# Patient Record
Sex: Male | Born: 1975 | Race: White | Hispanic: No | Marital: Married | Smoking: Never smoker
Health system: Southern US, Community
[De-identification: ages and names within clinical notes are randomized; demographics above are authoritative.]

## PROBLEM LIST (undated history)

## (undated) DIAGNOSIS — H9071 Mixed conductive and sensorineural hearing loss, unilateral, right ear, with unrestricted hearing on the contralateral side: Secondary | ICD-10-CM

## (undated) DIAGNOSIS — G5602 Carpal tunnel syndrome, left upper limb: Secondary | ICD-10-CM

## (undated) HISTORY — PX: HAND SURGERY: SHX662

## (undated) HISTORY — PX: HERNIA REPAIR: SHX51

## (undated) HISTORY — PX: TONSILLECTOMY: SUR1361

---

## 2010-06-01 ENCOUNTER — Ambulatory Visit (HOSPITAL_COMMUNITY): Admission: EM | Admit: 2010-06-01 | Discharge: 2010-06-02 | Payer: Self-pay | Admitting: Emergency Medicine

## 2010-09-17 LAB — DIFFERENTIAL
Basophils Absolute: 0 10*3/uL (ref 0.0–0.1)
Basophils Relative: 0 % (ref 0–1)
Eosinophils Absolute: 0 10*3/uL (ref 0.0–0.7)
Lymphs Abs: 1.2 10*3/uL (ref 0.7–4.0)
Neutrophils Relative %: 81 % — ABNORMAL HIGH (ref 43–77)

## 2010-09-17 LAB — PROTIME-INR
INR: 0.95 (ref 0.00–1.49)
Prothrombin Time: 12.9 seconds (ref 11.6–15.2)

## 2010-09-17 LAB — CBC
MCH: 30.8 pg (ref 26.0–34.0)
MCHC: 36.3 g/dL — ABNORMAL HIGH (ref 30.0–36.0)
Platelets: 49 10*3/uL — ABNORMAL LOW (ref 150–400)
RBC: 5.07 MIL/uL (ref 4.22–5.81)
RDW: 12.2 % (ref 11.5–15.5)

## 2010-09-17 LAB — POCT I-STAT, CHEM 8
Calcium, Ion: 0.89 mmol/L — ABNORMAL LOW (ref 1.12–1.32)
Glucose, Bld: 92 mg/dL (ref 70–99)
HCT: 44 % (ref 39.0–52.0)
Hemoglobin: 15 g/dL (ref 13.0–17.0)
TCO2: 23 mmol/L (ref 0–100)

## 2011-10-29 IMAGING — CR DG CHEST 1V PORT
1 series · 1 of 1 positions shown · non-contrast
Comparison: 06/01/2010

CLINICAL DATA: Central line placement

PORTABLE CHEST - 1 VIEW

[view not recorded]
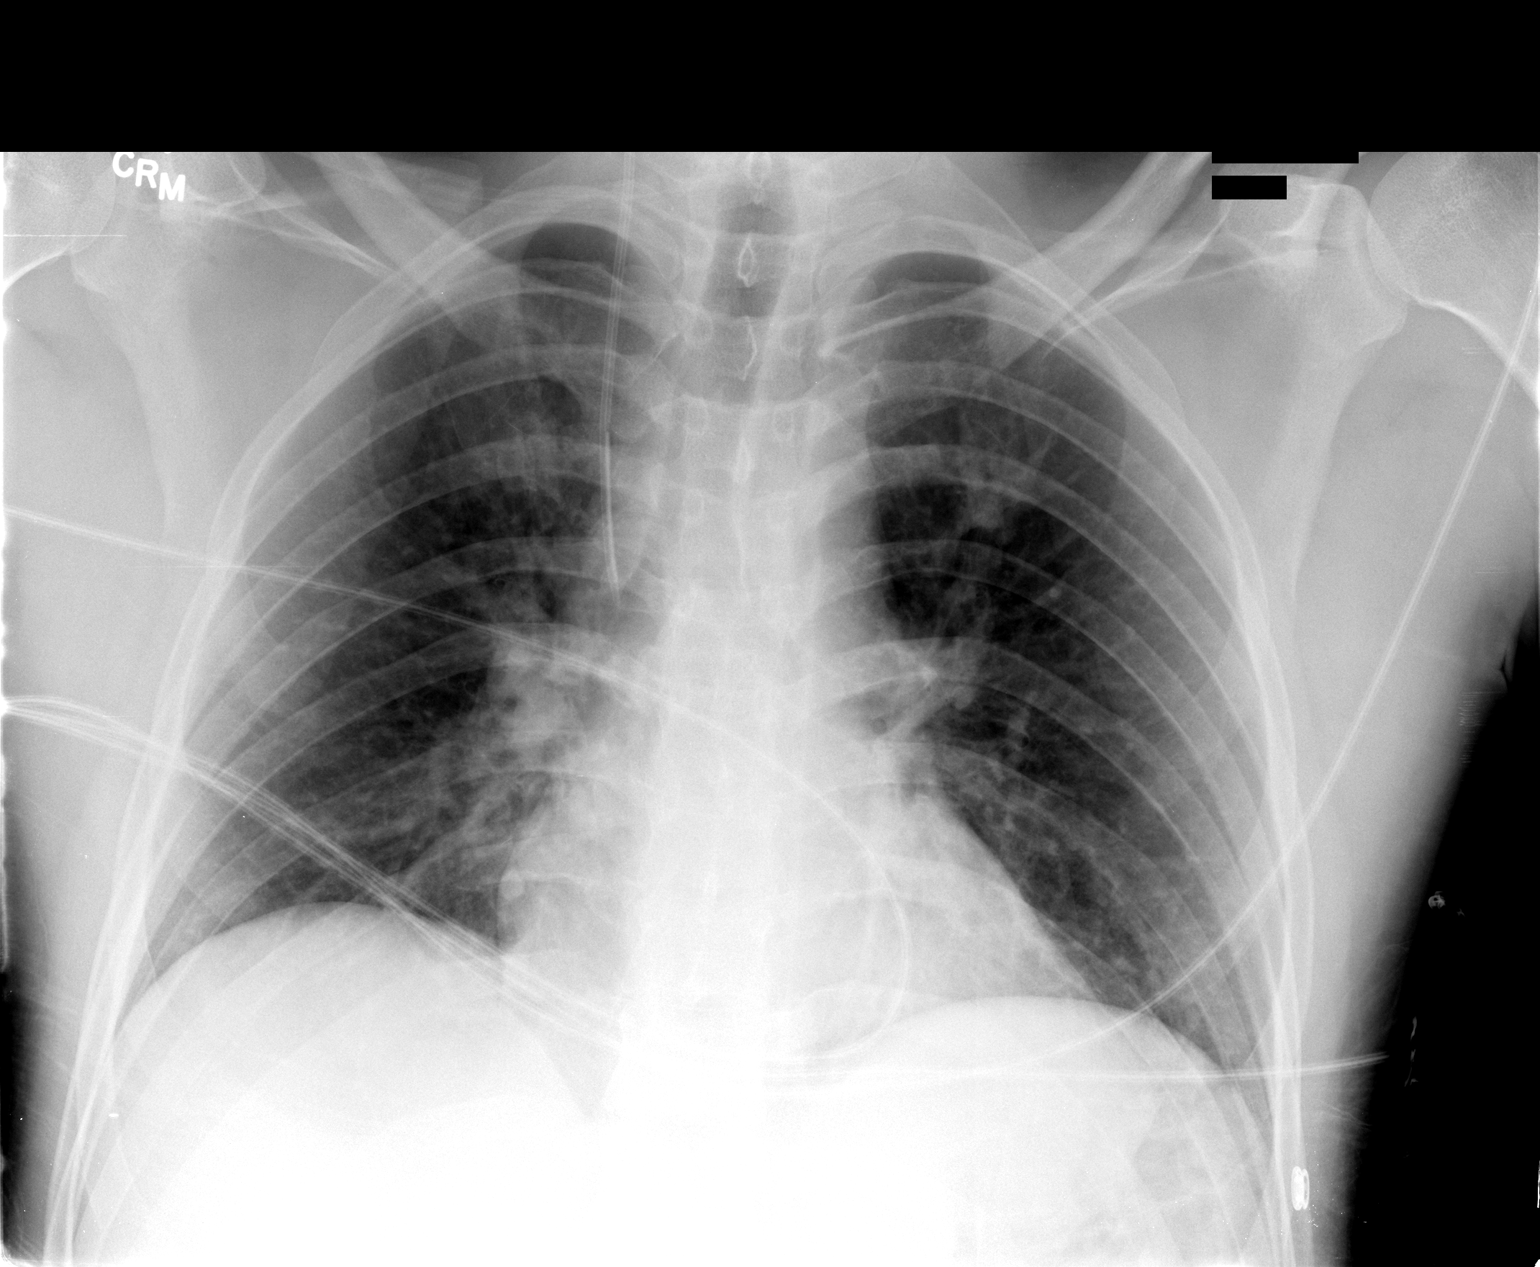

[1 of 1 positions shown; findings below may reference images not displayed]

FINDINGS: Interval placement right-sided internal jugular line with
the tip in the upper SVC.  There is no pneumothorax.  Lung volumes
are low with increasing bibasilar atelectasis.  No edema is seen.
The heart is normal in size.  The upper abdomen and osseous
structures are unchanged.
IMPRESSION: Uncomplicated placement of a right upper extremity central line.
Increasing bibasilar atelectasis without other interval change.

## 2018-04-14 ENCOUNTER — Other Ambulatory Visit: Payer: Self-pay | Admitting: *Deleted

## 2018-04-14 ENCOUNTER — Encounter: Payer: Self-pay | Admitting: Neurology

## 2018-04-14 DIAGNOSIS — G5602 Carpal tunnel syndrome, left upper limb: Secondary | ICD-10-CM

## 2018-04-19 ENCOUNTER — Ambulatory Visit (INDEPENDENT_AMBULATORY_CARE_PROVIDER_SITE_OTHER): Payer: BLUE CROSS/BLUE SHIELD | Admitting: Neurology

## 2018-04-19 DIAGNOSIS — G5602 Carpal tunnel syndrome, left upper limb: Secondary | ICD-10-CM | POA: Diagnosis not present
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
## 2018-05-05 ENCOUNTER — Other Ambulatory Visit: Payer: Self-pay | Admitting: Orthopedic Surgery

## 2018-05-19 ENCOUNTER — Encounter (HOSPITAL_BASED_OUTPATIENT_CLINIC_OR_DEPARTMENT_OTHER): Payer: Self-pay | Admitting: *Deleted

## 2018-05-19 ENCOUNTER — Other Ambulatory Visit: Payer: Self-pay

## 2018-05-23 ENCOUNTER — Other Ambulatory Visit: Payer: Self-pay

## 2018-05-23 ENCOUNTER — Ambulatory Visit (HOSPITAL_BASED_OUTPATIENT_CLINIC_OR_DEPARTMENT_OTHER): Payer: BLUE CROSS/BLUE SHIELD | Admitting: Anesthesiology

## 2018-05-23 ENCOUNTER — Encounter (HOSPITAL_BASED_OUTPATIENT_CLINIC_OR_DEPARTMENT_OTHER): Payer: Self-pay | Admitting: Anesthesiology

## 2018-05-23 ENCOUNTER — Ambulatory Visit (HOSPITAL_BASED_OUTPATIENT_CLINIC_OR_DEPARTMENT_OTHER)
Admission: RE | Admit: 2018-05-23 | Discharge: 2018-05-23 | Disposition: A | Discharge status: Home / Self Care | Payer: BLUE CROSS/BLUE SHIELD | Source: Ambulatory Visit | Attending: Orthopedic Surgery | Admitting: Orthopedic Surgery

## 2018-05-23 ENCOUNTER — Encounter (HOSPITAL_BASED_OUTPATIENT_CLINIC_OR_DEPARTMENT_OTHER)
Admission: RE | Disposition: A | Discharge status: Home / Self Care | Payer: Self-pay | Source: Ambulatory Visit | Attending: Orthopedic Surgery

## 2018-05-23 DIAGNOSIS — G5602 Carpal tunnel syndrome, left upper limb: Secondary | ICD-10-CM | POA: Diagnosis not present

## 2018-05-23 HISTORY — DX: Mixed conductive and sensorineural hearing loss, unilateral, right ear, with unrestricted hearing on the contralateral side: H90.71

## 2018-05-23 HISTORY — DX: Carpal tunnel syndrome, left upper limb: G56.02

## 2018-05-23 HISTORY — PX: CARPAL TUNNEL RELEASE: SHX101

## 2018-05-23 SURGERY — CARPAL TUNNEL RELEASE
Anesthesia: General | Site: Hand | Laterality: Left

## 2018-05-23 MED ORDER — FENTANYL CITRATE (PF) 100 MCG/2ML IJ SOLN
INTRAMUSCULAR | Status: AC
  Filled 2018-05-23: qty 2

## 2018-05-23 MED ORDER — HYDROCODONE-ACETAMINOPHEN 5-325 MG PO TABS: ORAL_TABLET | ORAL | 0 refills | Status: AC

## 2018-05-23 MED ORDER — DEXAMETHASONE SODIUM PHOSPHATE 10 MG/ML IJ SOLN
INTRAMUSCULAR | Status: AC
  Filled 2018-05-23: qty 1

## 2018-05-23 MED ORDER — MIDAZOLAM HCL 2 MG/2ML IJ SOLN
1.0000 mg | INTRAMUSCULAR | Status: DC | PRN
  Administered 2018-05-23: 2 mg via INTRAVENOUS

## 2018-05-23 MED ORDER — OXYCODONE HCL 5 MG PO TABS
ORAL_TABLET | ORAL | Status: AC
  Filled 2018-05-23: qty 1

## 2018-05-23 MED ORDER — LACTATED RINGERS IV SOLN
INTRAVENOUS | Status: DC
  Administered 2018-05-23: 13:00:00 via INTRAVENOUS

## 2018-05-23 MED ORDER — FENTANYL CITRATE (PF) 100 MCG/2ML IJ SOLN: 25.0000 ug | INTRAMUSCULAR | Status: DC | PRN

## 2018-05-23 MED ORDER — ONDANSETRON HCL 4 MG/2ML IJ SOLN
INTRAMUSCULAR | Status: AC
  Filled 2018-05-23: qty 2

## 2018-05-23 MED ORDER — SCOPOLAMINE 1 MG/3DAYS TD PT72: 1.0000 | MEDICATED_PATCH | Freq: Once | TRANSDERMAL | Status: DC | PRN

## 2018-05-23 MED ORDER — PROPOFOL 10 MG/ML IV BOLUS
INTRAVENOUS | Status: DC | PRN
  Administered 2018-05-23: 200 mg via INTRAVENOUS

## 2018-05-23 MED ORDER — PROPOFOL 500 MG/50ML IV EMUL
INTRAVENOUS | Status: AC
  Filled 2018-05-23: qty 50

## 2018-05-23 MED ORDER — LIDOCAINE HCL (CARDIAC) PF 100 MG/5ML IV SOSY
PREFILLED_SYRINGE | INTRAVENOUS | Status: DC | PRN
  Administered 2018-05-23: 100 mg via INTRAVENOUS

## 2018-05-23 MED ORDER — CHLORHEXIDINE GLUCONATE 4 % EX LIQD: 60.0000 mL | Freq: Once | CUTANEOUS | Status: DC

## 2018-05-23 MED ORDER — CEFAZOLIN SODIUM-DEXTROSE 2-4 GM/100ML-% IV SOLN
2.0000 g | INTRAVENOUS | Status: AC
  Administered 2018-05-23: 2 g via INTRAVENOUS

## 2018-05-23 MED ORDER — PROMETHAZINE HCL 25 MG/ML IJ SOLN: 6.2500 mg | INTRAMUSCULAR | Status: DC | PRN

## 2018-05-23 MED ORDER — FENTANYL CITRATE (PF) 100 MCG/2ML IJ SOLN
50.0000 ug | INTRAMUSCULAR | Status: DC | PRN
  Administered 2018-05-23: 100 ug via INTRAVENOUS

## 2018-05-23 MED ORDER — DEXAMETHASONE SODIUM PHOSPHATE 10 MG/ML IJ SOLN
INTRAMUSCULAR | Status: DC | PRN
  Administered 2018-05-23: 10 mg via INTRAVENOUS

## 2018-05-23 MED ORDER — ONDANSETRON HCL 4 MG/2ML IJ SOLN
INTRAMUSCULAR | Status: DC | PRN
  Administered 2018-05-23: 4 mg via INTRAVENOUS

## 2018-05-23 MED ORDER — CEFAZOLIN SODIUM-DEXTROSE 2-4 GM/100ML-% IV SOLN
INTRAVENOUS | Status: AC
  Filled 2018-05-23: qty 100

## 2018-05-23 MED ORDER — BUPIVACAINE HCL (PF) 0.25 % IJ SOLN
INTRAMUSCULAR | Status: DC | PRN
  Administered 2018-05-23: 10 mL

## 2018-05-23 MED ORDER — MIDAZOLAM HCL 2 MG/2ML IJ SOLN
INTRAMUSCULAR | Status: AC
  Filled 2018-05-23: qty 2

## 2018-05-23 MED ORDER — OXYCODONE HCL 5 MG PO TABS
5.0000 mg | ORAL_TABLET | Freq: Once | ORAL | Status: AC
  Administered 2018-05-23: 5 mg via ORAL

## 2018-05-23 SURGICAL SUPPLY — 33 items
BANDAGE ACE 3X5.8 VEL STRL LF (GAUZE/BANDAGES/DRESSINGS) ×3 IMPLANT
BLADE SURG 15 STRL LF DISP TIS (BLADE) ×2 IMPLANT
BLADE SURG 15 STRL SS (BLADE) ×4
BNDG ESMARK 4X9 LF (GAUZE/BANDAGES/DRESSINGS) ×3 IMPLANT
BNDG GAUZE ELAST 4 BULKY (GAUZE/BANDAGES/DRESSINGS) ×3 IMPLANT
CHLORAPREP W/TINT 26ML (MISCELLANEOUS) ×3 IMPLANT
CORD BIPOLAR FORCEPS 12FT (ELECTRODE) ×3 IMPLANT
COVER BACK TABLE 60X90IN (DRAPES) ×3 IMPLANT
COVER MAYO STAND STRL (DRAPES) ×3 IMPLANT
COVER WAND RF STERILE (DRAPES) IMPLANT
CUFF TOURNIQUET SINGLE 18IN (TOURNIQUET CUFF) ×3 IMPLANT
DRAPE EXTREMITY T 121X128X90 (DRAPE) ×3 IMPLANT
DRAPE SURG 17X23 STRL (DRAPES) ×3 IMPLANT
DRSG PAD ABDOMINAL 8X10 ST (GAUZE/BANDAGES/DRESSINGS) ×3 IMPLANT
GAUZE SPONGE 4X4 12PLY STRL (GAUZE/BANDAGES/DRESSINGS) ×3 IMPLANT
GAUZE XEROFORM 1X8 LF (GAUZE/BANDAGES/DRESSINGS) ×3 IMPLANT
GLOVE BIO SURGEON STRL SZ7.5 (GLOVE) ×3 IMPLANT
GLOVE BIOGEL PI IND STRL 8 (GLOVE) ×1 IMPLANT
GLOVE BIOGEL PI INDICATOR 8 (GLOVE) ×2
GOWN STRL REUS W/ TWL LRG LVL3 (GOWN DISPOSABLE) ×1 IMPLANT
GOWN STRL REUS W/TWL LRG LVL3 (GOWN DISPOSABLE) ×2
GOWN STRL REUS W/TWL XL LVL3 (GOWN DISPOSABLE) ×3 IMPLANT
NEEDLE HYPO 25X1 1.5 SAFETY (NEEDLE) ×3 IMPLANT
NS IRRIG 1000ML POUR BTL (IV SOLUTION) ×3 IMPLANT
PACK BASIN DAY SURGERY FS (CUSTOM PROCEDURE TRAY) ×3 IMPLANT
PADDING CAST ABS 4INX4YD NS (CAST SUPPLIES) ×2
PADDING CAST ABS COTTON 4X4 ST (CAST SUPPLIES) ×1 IMPLANT
STOCKINETTE 4X48 STRL (DRAPES) ×3 IMPLANT
SUT ETHILON 4 0 PS 2 18 (SUTURE) ×3 IMPLANT
SYR BULB 3OZ (MISCELLANEOUS) ×3 IMPLANT
SYR CONTROL 10ML LL (SYRINGE) ×3 IMPLANT
TOWEL GREEN STERILE FF (TOWEL DISPOSABLE) ×6 IMPLANT
UNDERPAD 30X30 (UNDERPADS AND DIAPERS) ×3 IMPLANT

## 2018-05-23 NOTE — Anesthesia Preprocedure Evaluation (Addendum)
Anesthesia Evaluation  Patient identified by MRN, date of birth, ID band Patient awake    Reviewed: Allergy & Precautions, NPO status , Patient's Chart, lab work & pertinent test results  Airway Mallampati: II  TM Distance: >3 FB Neck ROM: Full    Dental  (+) Teeth Intact, Dental Advisory Given   Pulmonary neg pulmonary ROS,    Pulmonary exam normal breath sounds clear to auscultation       Cardiovascular Exercise Tolerance: Good negative cardio ROS Normal cardiovascular exam Rhythm:Regular Rate:Normal     Neuro/Psych SDH, resolved  Neuromuscular disease    GI/Hepatic negative GI ROS, Neg liver ROS, neg GERD  ,  Endo/Other  negative endocrine ROS  Renal/GU negative Renal ROS     Musculoskeletal CTS, left hand   Abdominal   Peds  Hematology negative hematology ROS (+)   Anesthesia Other Findings Day of surgery medications reviewed with the patient.  Reproductive/Obstetrics                            Anesthesia Physical Anesthesia Plan  ASA: II  Anesthesia Plan: General   Post-op Pain Management:    Induction: Intravenous  PONV Risk Score and Plan: 2 and Dexamethasone, Ondansetron and Midazolam  Airway Management Planned: LMA  Additional Equipment:   Intra-op Plan:   Post-operative Plan: Extubation in OR  Informed Consent: I have reviewed the patients History and Physical, chart, labs and discussed the procedure including the risks, benefits and alternatives for the proposed anesthesia with the patient or authorized representative who has indicated his/her understanding and acceptance.   Dental advisory given  Plan Discussed with: CRNA  Anesthesia Plan Comments:        Anesthesia Quick Evaluation

## 2018-05-23 NOTE — Anesthesia Procedure Notes (Signed)
Procedure Name: LMA Insertion Date/Time: 05/23/2018 1:13 PM Performed by: Burna Cashonrad, Layann Bluett C, CRNA Pre-anesthesia Checklist: Patient identified, Emergency Drugs available, Suction available and Patient being monitored Patient Re-evaluated:Patient Re-evaluated prior to induction Oxygen Delivery Method: Circle system utilized Preoxygenation: Pre-oxygenation with 100% oxygen Induction Type: IV induction Ventilation: Mask ventilation without difficulty LMA: LMA inserted LMA Size: 5.0 Number of attempts: 1 Airway Equipment and Method: Bite block Placement Confirmation: positive ETCO2 Tube secured with: Tape Dental Injury: Teeth and Oropharynx as per pre-operative assessment

## 2018-05-23 NOTE — Transfer of Care (Signed)
Immediate Anesthesia Transfer of Care Note  Patient: Mike Burch  Procedure(s) Performed: LEFT CARPAL TUNNEL RELEASE (Left Hand)  Patient Location: PACU  Anesthesia Type:General  Level of Consciousness: sedated  Airway & Oxygen Therapy: Patient Spontanous Breathing and Patient connected to face mask oxygen  Post-op Assessment: Report given to RN and Post -op Vital signs reviewed and stable  Post vital signs: Reviewed and stable  Last Vitals:  Vitals Value Taken Time  BP 112/82 05/23/2018  1:42 PM  Temp    Pulse 82 05/23/2018  1:44 PM  Resp 15 05/23/2018  1:44 PM  SpO2 99 % 05/23/2018  1:44 PM  Vitals shown include unvalidated device data.  Last Pain:  Vitals:   05/23/18 1238  TempSrc: Oral  PainSc: 0-No pain      Patients Stated Pain Goal: 3 (05/23/18 1238)  Complications: No apparent anesthesia complications

## 2018-05-23 NOTE — Discharge Instructions (Addendum)

## 2018-05-23 NOTE — Anesthesia Postprocedure Evaluation (Signed)
Anesthesia Post Note  Patient: Lindwood QuaJohn Dormer  Procedure(s) Performed: LEFT CARPAL TUNNEL RELEASE (Left Hand)     Patient location during evaluation: PACU Anesthesia Type: General Level of consciousness: awake and alert, awake and oriented Pain management: pain level controlled Vital Signs Assessment: post-procedure vital signs reviewed and stable Respiratory status: spontaneous breathing, nonlabored ventilation and respiratory function stable Cardiovascular status: blood pressure returned to baseline and stable Postop Assessment: no apparent nausea or vomiting Anesthetic complications: no    Last Vitals:  Vitals:   05/23/18 1400 05/23/18 1415  BP: 123/89 (!) 125/92  Pulse: 73 86  Resp: 16 17  Temp:    SpO2: 100% 94%    Last Pain:  Vitals:   05/23/18 1400  TempSrc:   PainSc: 4                  Cecile HearingStephen Edward Tyger Wichman

## 2018-05-23 NOTE — Op Note (Signed)
05/23/2018 [facility]                              OPERATIVE REPORT   PREOPERATIVE DIAGNOSIS:  Left carpal tunnel syndrome.  POSTOPERATIVE DIAGNOSIS:  Left carpal tunnel syndrome.  PROCEDURE:  Left carpal tunnel release.  SURGEON:  Mike Loa, MD  ASSISTANT:  none.  ANESTHESIA: General  IV FLUIDS:  Per anesthesia flow sheet.  ESTIMATED BLOOD LOSS:  Minimal.  COMPLICATIONS:  None.  SPECIMENS:  None.  TOURNIQUET TIME:   * Missing tourniquet times found for documented tourniquets in log: [id number] *  DISPOSITION:  Stable to PACU.  LOCATION:  SURGERY CENTER  INDICATIONS:  42 yo male with numbness in left hand.  Positive nerve conduction studies.  He wishes to have a carpal tunnel release for management of his symptoms.  Risks, benefits and alternatives of surgery were discussed including the risk of blood loss; infection; damage to nerves, vessels, tendons, ligaments, bone; failure of surgery; need for additional surgery; complications with wound healing; continued pain; recurrence of carpal tunnel syndrome; and damage to motor branch. He voiced understanding of these risks and elected to proceed.   OPERATIVE COURSE:  After being identified preoperatively by myself, the patient and I agreed upon the procedure and site of procedure.  The surgical site was marked.  The risks, benefits, and alternatives of the surgery were reviewed and he wished to proceed.  Surgical consent had been signed.  He was given IV Ancef as preoperative antibiotic prophylaxis.  He was transferred to the operating room and placed on the operating room table in supine position with the Left upper extremity on an armboard.  General anesthesia was induced by the anesthesiologist.  Left upper extremity was prepped and draped in normal sterile orthopaedic fashion.  A surgical pause was performed between the surgeons, anesthesia, and operating room staff, and all were in agreement as to the  patient, procedure, and site of procedure.  Tourniquet at the proximal aspect of the extremity was inflated to 250 mmHg after exsanguination of the arm with an Esmarch bandage  Incision was made over the transverse carpal ligament and carried into the subcutaneous tissues by spreading technique.  Bipolar electrocautery was used to obtain hemostasis.  The palmar fascia was sharply incised.  The transverse carpal ligament was identified and sharply incised.  It was incised distally first.  Care was taken to ensure complete decompression distally.  It was then incised proximally.  Scissors were used to split the distal aspect of the volar antebrachial fascia.  A finger was placed into the wound to ensure complete decompression, which was the case.  The nerve was examined.  It was flattened and hyperemic. and It was adherent to the radial leaflet.  The motor branch was identified and was intact.  The wound was copiously irrigated with sterile saline.  It was then closed with 4-0 nylon in a horizontal mattress fashion.  It was injected with 0.25% plain Marcaine to aid in postoperative analgesia.  It was dressed with sterile Xeroform, 4x4s, an ABD, and wrapped with Kerlix and an Ace bandage.  Tourniquet was deflated at 15 minutes.  Fingertips were pink with brisk capillary refill after deflation of the tourniquet.  Operative drapes were broken down.  The patient was awoken from anesthesia safely.  He was transferred back to stretcher and taken to the PACU in stable condition.  I will see him back  in the office in 1 week for postoperative followup.  I will give him a prescription for Norco 5/325 1-2 tabs PO q6 hours prn pain, dispense # 20.    Mike LoaKevin Marshon Bangs, MD Electronically signed, 05/23/18
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
## 2018-05-24 ENCOUNTER — Encounter (HOSPITAL_BASED_OUTPATIENT_CLINIC_OR_DEPARTMENT_OTHER): Payer: Self-pay | Admitting: Orthopedic Surgery

## 2020-06-19 ENCOUNTER — Ambulatory Visit (HOSPITAL_COMMUNITY)
Admission: RE | Admit: 2020-06-19 | Discharge: 2020-06-19 | Disposition: A | Discharge status: Home / Self Care | Payer: BC Managed Care – PPO | Source: Ambulatory Visit | Attending: Pulmonary Disease | Admitting: Pulmonary Disease

## 2020-06-19 ENCOUNTER — Other Ambulatory Visit: Payer: Self-pay | Admitting: Nurse Practitioner

## 2020-06-19 DIAGNOSIS — U071 COVID-19: Secondary | ICD-10-CM

## 2020-06-19 MED ORDER — METHYLPREDNISOLONE SODIUM SUCC 125 MG IJ SOLR: 125.0000 mg | Freq: Once | INTRAMUSCULAR | Status: DC | PRN

## 2020-06-19 MED ORDER — EPINEPHRINE 0.3 MG/0.3ML IJ SOAJ: 0.3000 mg | Freq: Once | INTRAMUSCULAR | Status: DC | PRN

## 2020-06-19 MED ORDER — SODIUM CHLORIDE 0.9 % IV SOLN: Freq: Once | INTRAVENOUS | Status: AC

## 2020-06-19 MED ORDER — ACETAMINOPHEN 325 MG PO TABS
650.0000 mg | ORAL_TABLET | Freq: Once | ORAL | Status: AC
  Administered 2020-06-19: 650 mg via ORAL
  Filled 2020-06-19: qty 2

## 2020-06-19 MED ORDER — DIPHENHYDRAMINE HCL 50 MG/ML IJ SOLN: 50.0000 mg | Freq: Once | INTRAMUSCULAR | Status: DC | PRN

## 2020-06-19 MED ORDER — SODIUM CHLORIDE 0.9 % IV SOLN: INTRAVENOUS | Status: DC | PRN

## 2020-06-19 MED ORDER — ALBUTEROL SULFATE HFA 108 (90 BASE) MCG/ACT IN AERS: 2.0000 | INHALATION_SPRAY | Freq: Once | RESPIRATORY_TRACT | Status: DC | PRN

## 2020-06-19 MED ORDER — FAMOTIDINE IN NACL 20-0.9 MG/50ML-% IV SOLN: 20.0000 mg | Freq: Once | INTRAVENOUS | Status: DC | PRN

## 2020-06-19 NOTE — Progress Notes (Signed)
  Diagnosis: COVID-19  Physician: Dr. Wright  Procedure: [facility] Med: bamlanivimab\etesevimab infusion - Provided patient with bamlanimivab\etesevimab fact sheet for patients, parents and caregivers prior to infusion.  Complications: No immediate complications noted.  Discharge: Discharged home   Homer Miller J Folasade Mooty 06/19/2020  

## 2020-06-19 NOTE — Progress Notes (Signed)
I connected by phone with Mike Burch on 06/19/2020 at 12:06 PM to discuss the potential use of a new treatment for mild to moderate COVID-19 viral infection in non-hospitalized patients.  This patient is a 44 y.o. male that meets the FDA criteria for Emergency Use Authorization of COVID monoclonal antibody casirivimab/imdevimab, bamlanivimab/etesevimab, or sotrovimab.  Has a (+) direct SARS-CoV-2 viral test result  Has mild or moderate COVID-19   Is NOT hospitalized due to COVID-19  Is within 10 days of symptom onset  Has at least one of the high risk factor(s) for progression to severe COVID-19 and/or hospitalization as defined in EUA.  Specific high risk criteria : Other high risk medical condition per CDC:  previous brain injury/neuro   I have spoken and communicated the following to the patient or parent/caregiver regarding COVID monoclonal antibody treatment:  1. FDA has authorized the emergency use for the treatment of mild to moderate COVID-19 in adults and pediatric patients with positive results of direct SARS-CoV-2 viral testing who are 42 years of age and older weighing at least 40 kg, and who are at high risk for progressing to severe COVID-19 and/or hospitalization.  2. The significant known and potential risks and benefits of COVID monoclonal antibody, and the extent to which such potential risks and benefits are unknown.  3. Information on available alternative treatments and the risks and benefits of those alternatives, including clinical trials.  4. Patients treated with COVID monoclonal antibody should continue to self-isolate and use infection control measures (e.g., wear mask, isolate, social distance, avoid sharing personal items, clean and disinfect "high touch" surfaces, and frequent handwashing) according to CDC guidelines.   5. The patient or parent/caregiver has the option to accept or refuse COVID monoclonal antibody treatment.  After reviewing this information  with the patient, the patient has agreed to receive one of the available covid 19 monoclonal antibodies and will be provided an appropriate fact sheet prior to infusion. Mayra Reel, NP 06/19/2020 12:06 PM

## 2020-06-19 NOTE — Discharge Instructions (Signed)
10 Things You Can Do to Manage Your COVID-19 Symptoms at Home If you have possible or confirmed COVID-19: 1. Stay home from work and school. And stay away from other public places. If you must go out, avoid using any kind of public transportation, ridesharing, or taxis. 2. Monitor your symptoms carefully. If your symptoms get worse, call your healthcare provider immediately. 3. Get rest and stay hydrated. 4. If you have a medical appointment, call the healthcare provider ahead of time and tell them that you have or may have COVID-19. 5. For medical emergencies, call 911 and notify the dispatch personnel that you have or may have COVID-19. 6. Cover your cough and sneezes with a tissue or use the inside of your elbow. 7. Wash your hands often with soap and water for at least 20 seconds or clean your hands with an alcohol-based hand sanitizer that contains at least 60% alcohol. 8. As much as possible, stay in a specific room and away from other people in your home. Also, you should use a separate bathroom, if available. If you need to be around other people in or outside of the home, wear a mask. 9. Avoid sharing personal items with other people in your household, like dishes, towels, and bedding. 10. Clean all surfaces that are touched often, like counters, tabletops, and doorknobs. Use household cleaning sprays or wipes according to the label instructions. cdc.gov/coronavirus 01/04/2019 This information is not intended to replace advice given to you by your health care provider. Make sure you discuss any questions you have with your health care provider. Document Revised: 06/08/2019 Document Reviewed: 06/08/2019 Elsevier Patient Education  2020 Elsevier Inc. What types of side effects do monoclonal antibody drugs cause?  Common side effects  In general, the more common side effects caused by monoclonal antibody drugs include: . Allergic reactions, such as hives or itching . Flu-like signs and  symptoms, including chills, fatigue, fever, and muscle aches and pains . Nausea, vomiting . Diarrhea . Skin rashes . Low blood pressure   The CDC is recommending patients who receive monoclonal antibody treatments wait at least 90 days before being vaccinated.  Currently, there are no data on the safety and efficacy of mRNA COVID-19 vaccines in persons who received monoclonal antibodies or convalescent plasma as part of COVID-19 treatment. Based on the estimated half-life of such therapies as well as evidence suggesting that reinfection is uncommon in the 90 days after initial infection, vaccination should be deferred for at least 90 days, as a precautionary measure until additional information becomes available, to avoid interference of the antibody treatment with vaccine-induced immune responses. If you have any questions or concerns after the infusion please call the Advanced Practice Provider on call at [phone]. This number is ONLY intended for your use regarding questions or concerns about the infusion post-treatment side-effects.  Please do not provide this number to others for use. For return to work notes please contact your primary care provider.   If someone you know is interested in receiving treatment please have them call the COVID hotline at [phone].   

## 2020-06-19 NOTE — Progress Notes (Signed)
Patient reviewed Fact Sheet for Patients, Parents, and Caregivers for Emergency Use Authorization (EUA) of bamlanivimab and etesevimab for the Treatment of Coronavirus. Patient also reviewed and is agreeable to the estimated cost of treatment. Patient is agreeable to proceed.
